# Patient Record
Sex: Female | Born: 1980 | Race: White | Hispanic: Yes | Marital: Married | Smoking: Never smoker
Health system: Southern US, Community
[De-identification: ages and names within clinical notes are randomized; demographics above are authoritative.]

## PROBLEM LIST (undated history)

## (undated) DIAGNOSIS — Z789 Other specified health status: Secondary | ICD-10-CM

## (undated) HISTORY — DX: Other specified health status: Z78.9

## (undated) HISTORY — PX: NO PAST SURGERIES: SHX2092

---

## 2005-01-11 ENCOUNTER — Ambulatory Visit: Payer: Self-pay | Admitting: Obstetrics and Gynecology

## 2005-12-02 ENCOUNTER — Inpatient Hospital Stay: Payer: Self-pay | Admitting: Obstetrics and Gynecology

## 2011-09-24 ENCOUNTER — Emergency Department: Payer: Self-pay | Admitting: Emergency Medicine

## 2011-10-07 ENCOUNTER — Ambulatory Visit: Payer: Self-pay | Admitting: Family Medicine

## 2012-03-21 ENCOUNTER — Ambulatory Visit: Payer: Self-pay | Admitting: Advanced Practice Midwife

## 2012-04-21 ENCOUNTER — Inpatient Hospital Stay: Payer: Self-pay | Admitting: Obstetrics and Gynecology

## 2012-04-21 LAB — CBC WITH DIFFERENTIAL/PLATELET
Basophil %: 0.4 %
Eosinophil #: 0 10*3/uL (ref 0.0–0.7)
Eosinophil %: 0.1 %
HCT: 37.6 % (ref 35.0–47.0)
HGB: 12.3 g/dL (ref 12.0–16.0)
Lymphocyte %: 19.8 %
MCH: 31.1 pg (ref 26.0–34.0)
MCV: 95 fL (ref 80–100)
Monocyte %: 6.9 %
Neutrophil #: 13.3 10*3/uL — ABNORMAL HIGH (ref 1.4–6.5)
Platelet: 221 10*3/uL (ref 150–440)
WBC: 18.3 10*3/uL — ABNORMAL HIGH (ref 3.6–11.0)

## 2012-04-22 LAB — HEMOGLOBIN: HGB: 10.5 g/dL — ABNORMAL LOW (ref 12.0–16.0)

## 2013-10-25 IMAGING — US US OB < 14 WEEKS
1 series · 17 of 28 positions shown · non-contrast
Comparison: none

REASON FOR EXAM: right pelvic pain, 10 weeks pregnant
COMMENTS:   LMP: [DATE]

[Series 1: us ob < 14 weeks · 17 of 46 slices shown]
[im 1/46]
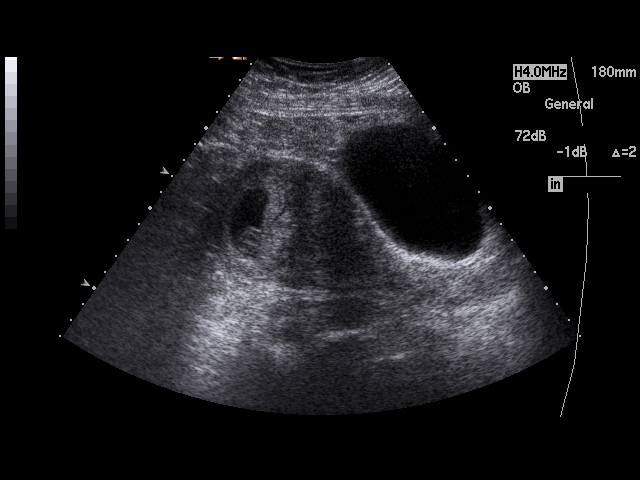
[im 4/46]
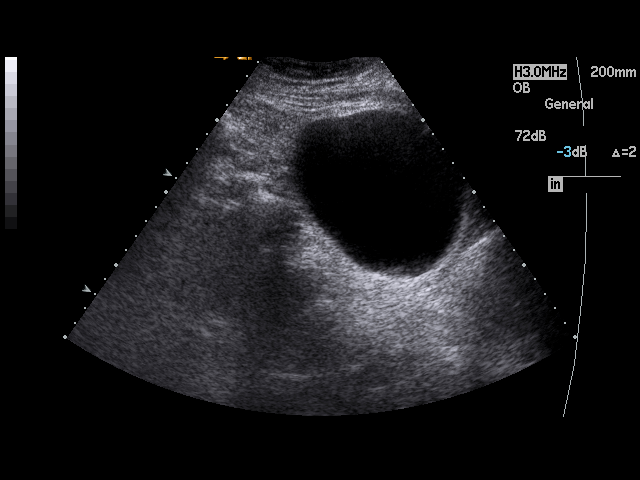
[im 7/46]
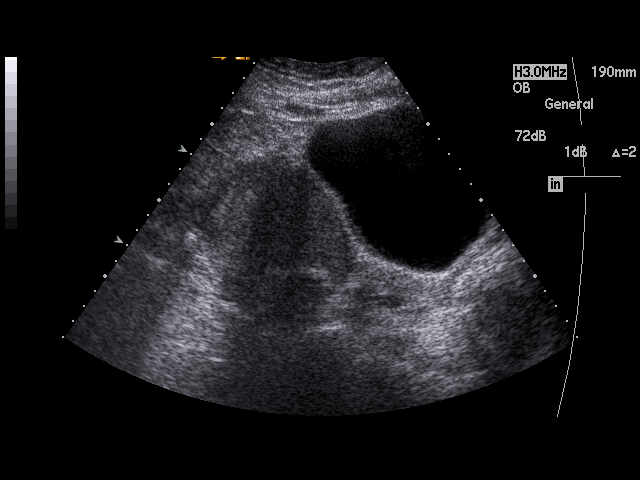
[im 9/46]
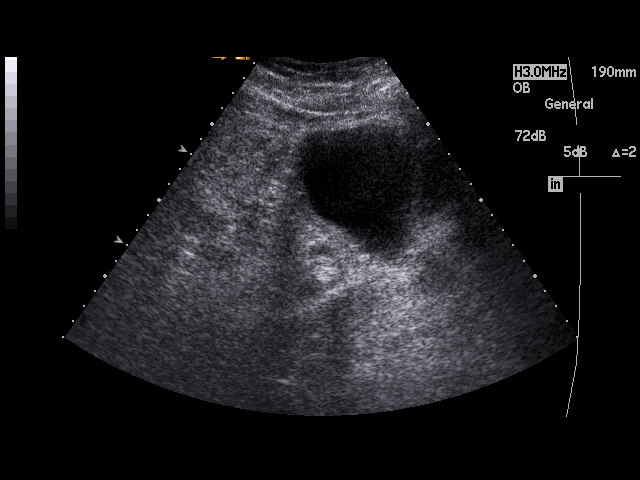
[im 12/46]
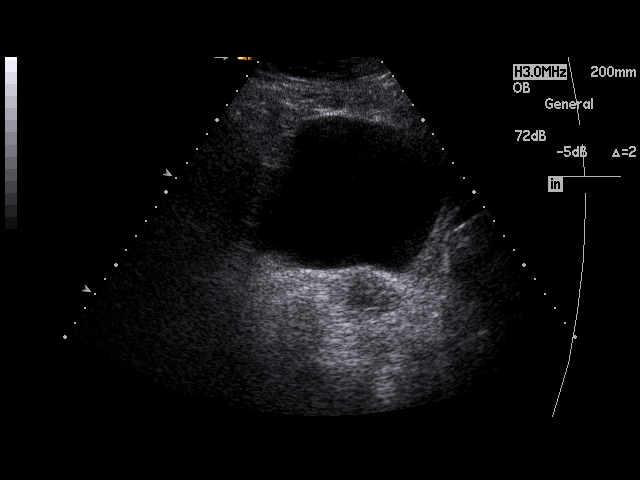
[im 16/46]
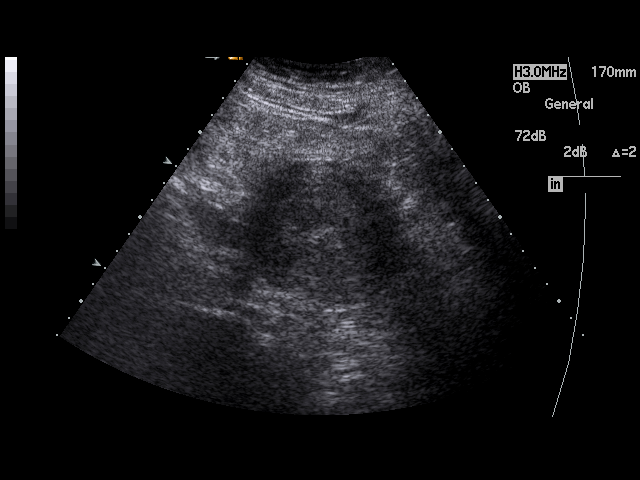
[im 17/46]
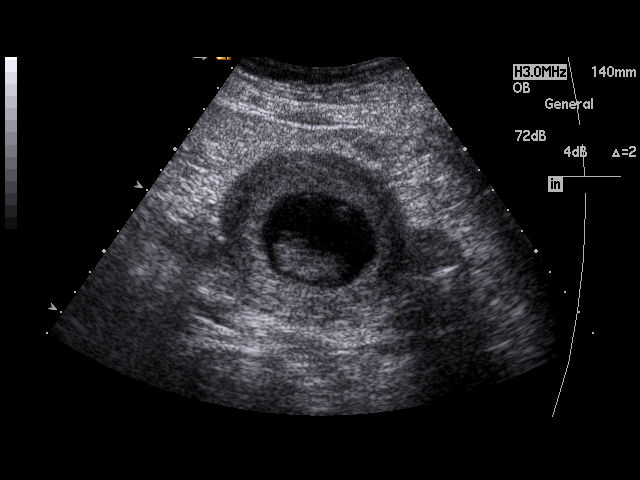
[im 21/46]
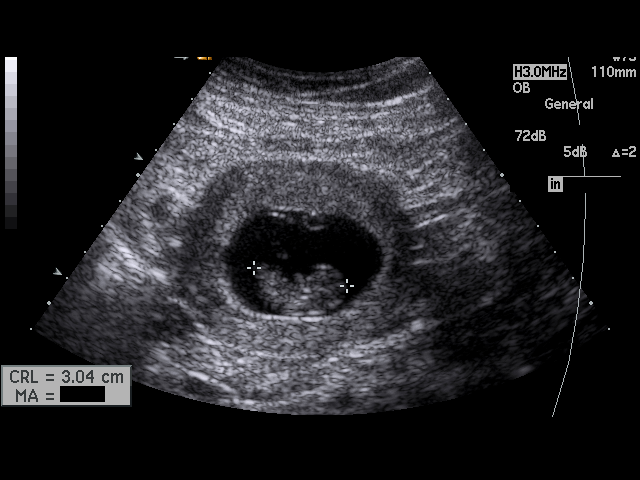
[im 24/46]
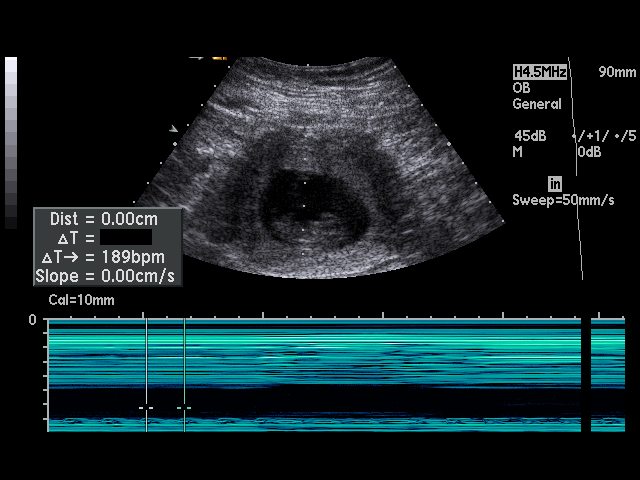
[im 26/46]
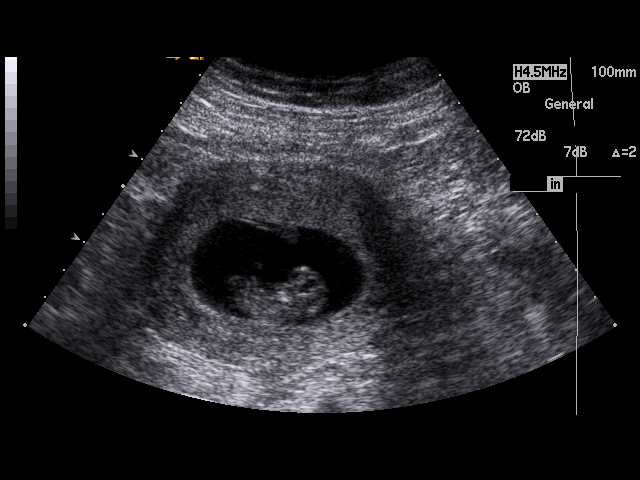
[im 29/46]
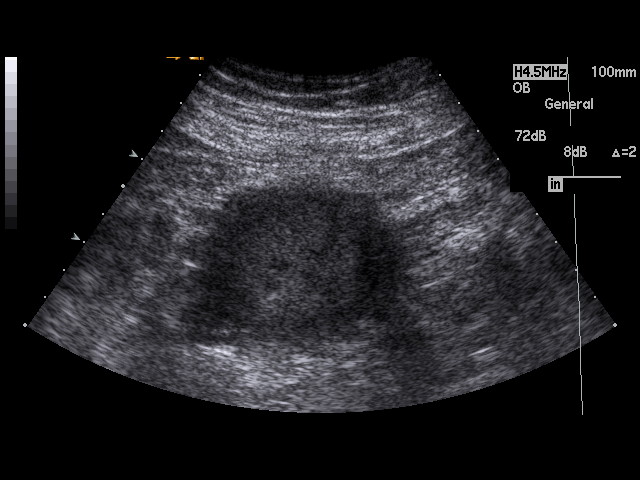
[im 31/46]
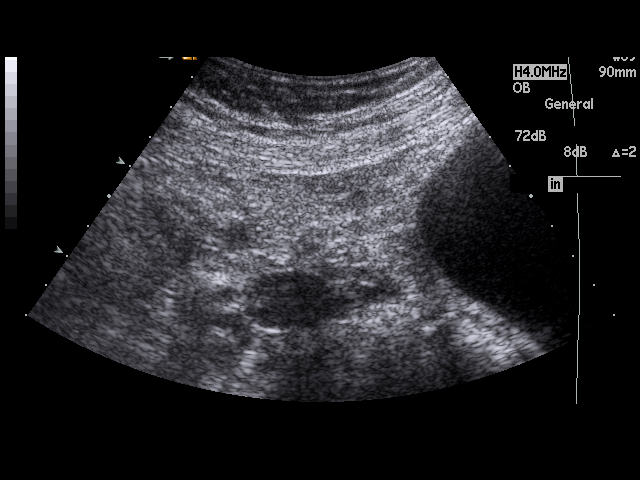
[im 34/46]
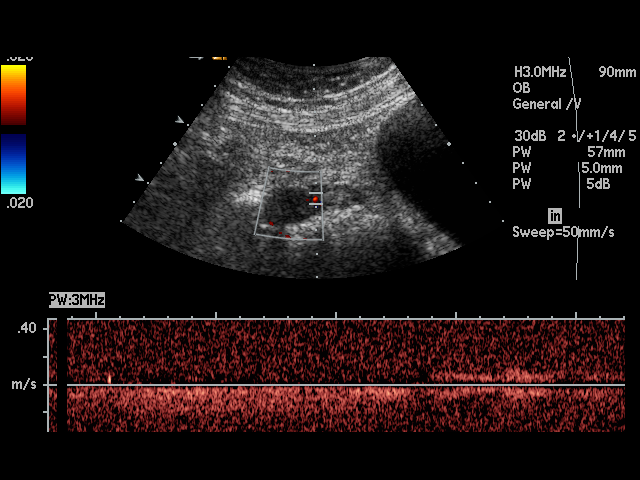
[im 37/46]
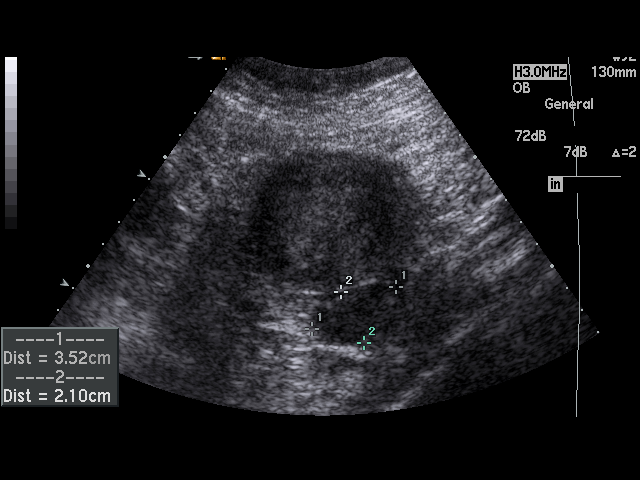
[im 39/46]
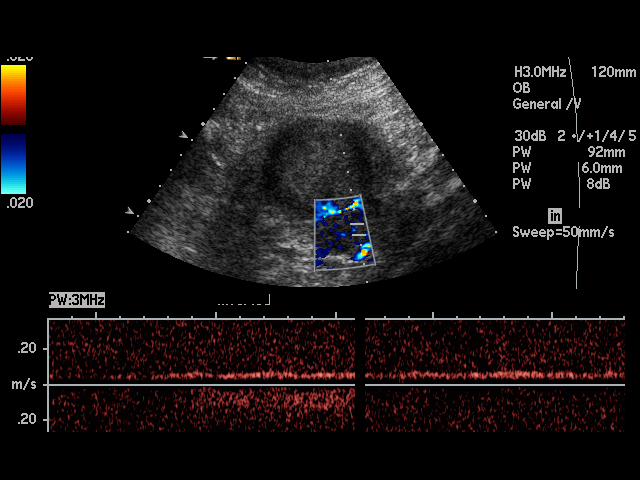
[im 42/46]
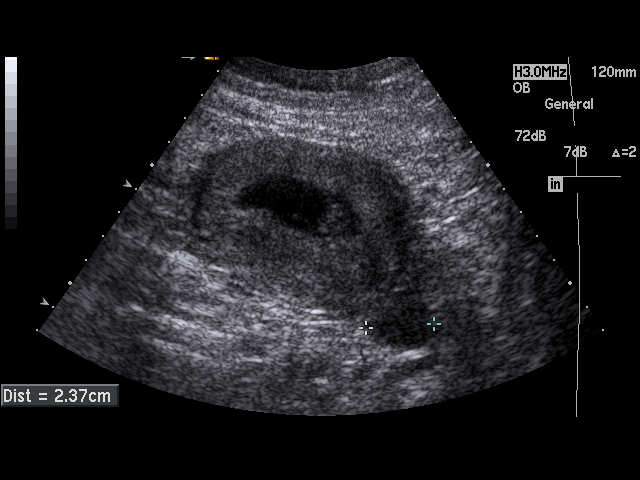
[im 46/46]
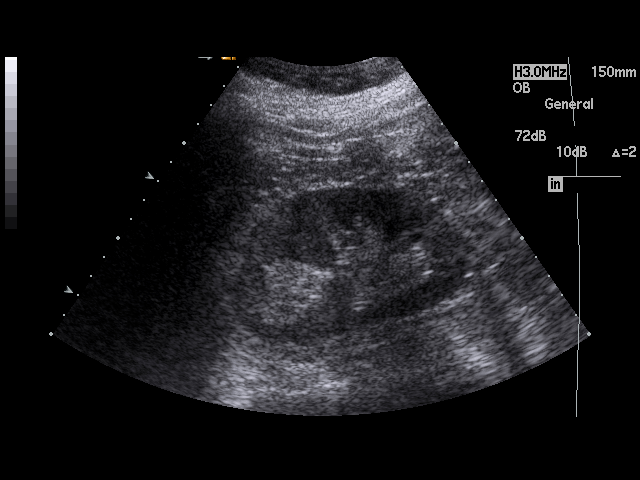

[17 of 28 positions shown; findings below may reference images not displayed]

PROCEDURE:     US  - US OB LESS THAN 14 WEEKS  - September 25, 2011  [DATE]

RESULT:     There is an IUP present. The crown-rump length measures 2.96 cm
corresponding to a 9 week 5 day gestation. A yolk sac is demonstrated. The
fetal cardiac rate is 189 beats per minute. The maternal ovaries exhibit
normal echotexture. The right ovary measures 3 x 2.6 x 1.6 cm. The left
ovary measures 3.5 x 2.4 x 2 cm. There is no free fluid in the pelvis.
IMPRESSION: There is a viable IUP with estimated gestational age of 9
weeks 5 days + / - 5 days. The estimated date of confinement is 24 April, 2012.
No pelvic masses are demonstrated.

A preliminary report was sent to the [HOSPITAL] the conclusion
of the study.

## 2014-04-21 IMAGING — US US OB US >=[ID] SNGL FETUS
1 series · 14 of 28 positions shown · non-contrast
Comparison: none

REASON FOR EXAM: size greater than dates
COMMENTS:

[Series 1: us ob us >=(id) sngl fetus · 0.31mm/px · 14 of 73 slices shown]
[im 3/73]
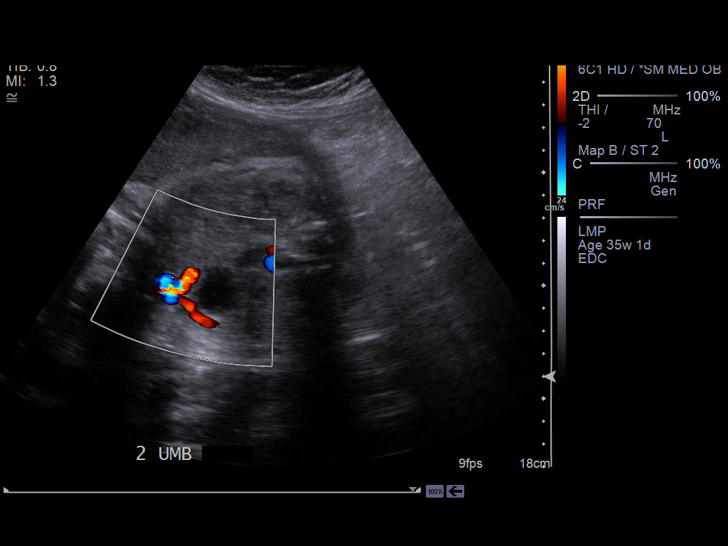
[im 9/73]
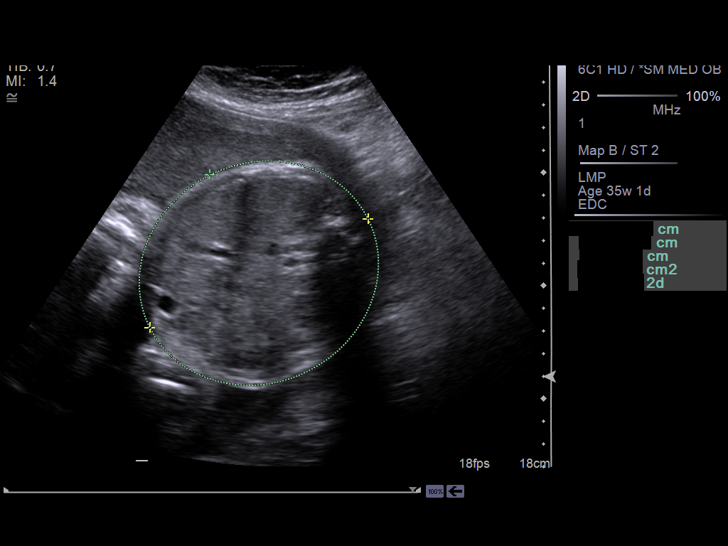
[im 14/73]
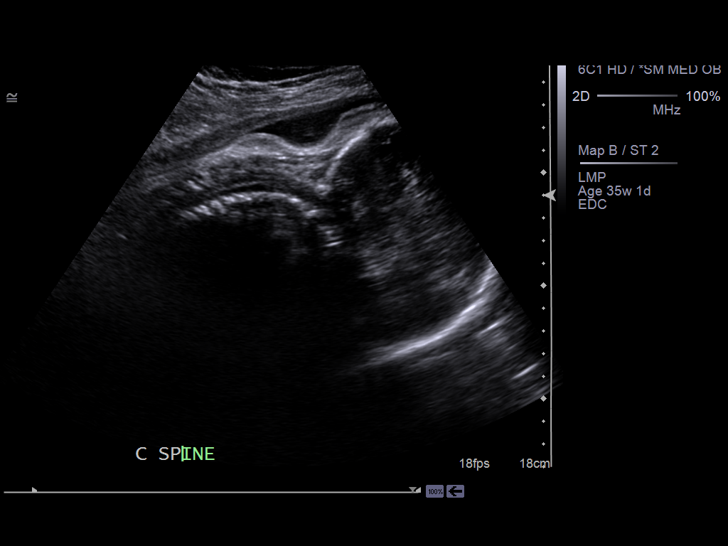
[im 19/73]
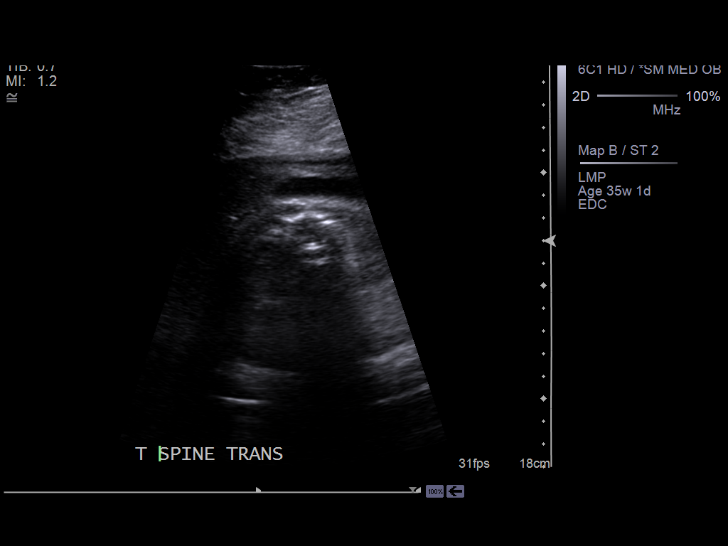
[im 25/73]
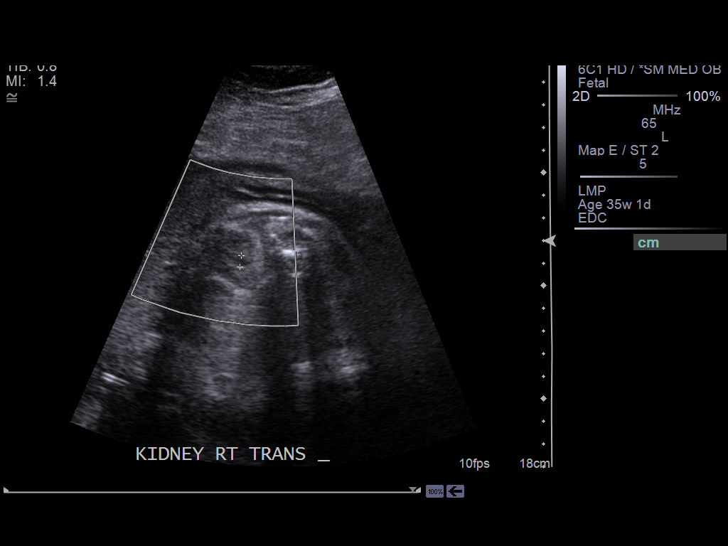
[im 30/73]
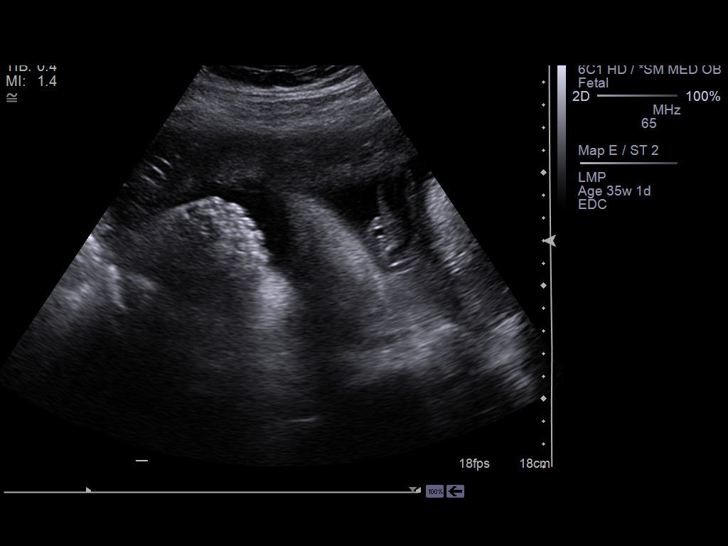
[im 35/73]
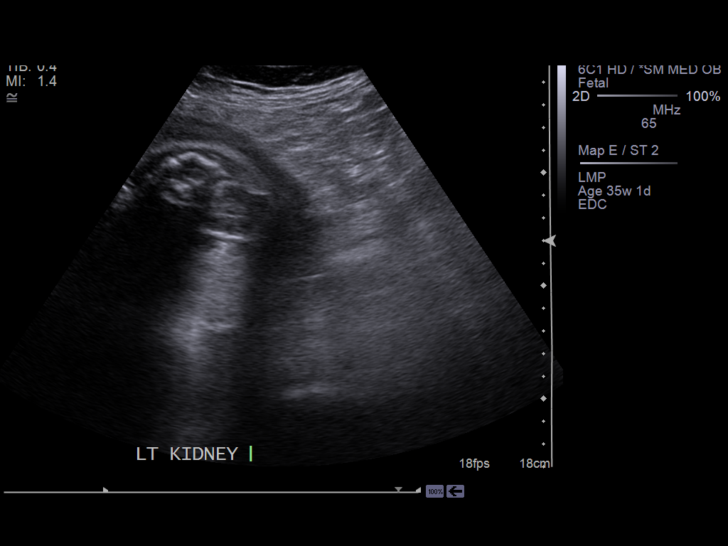
[im 41/73]
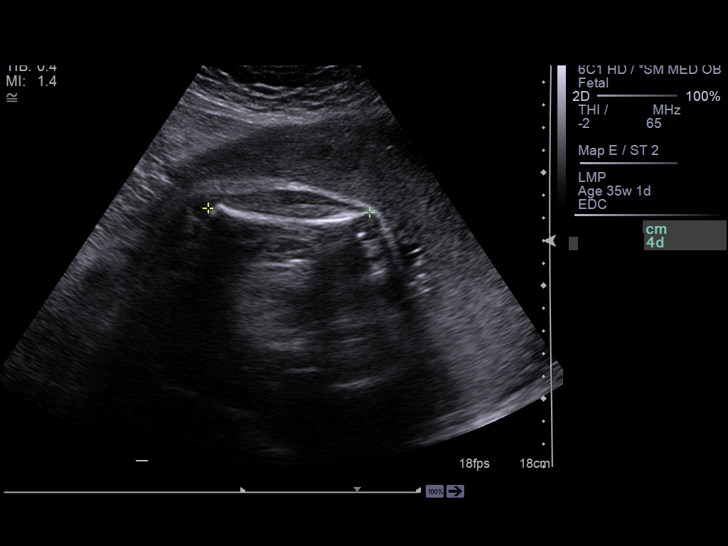
[im 46/73]
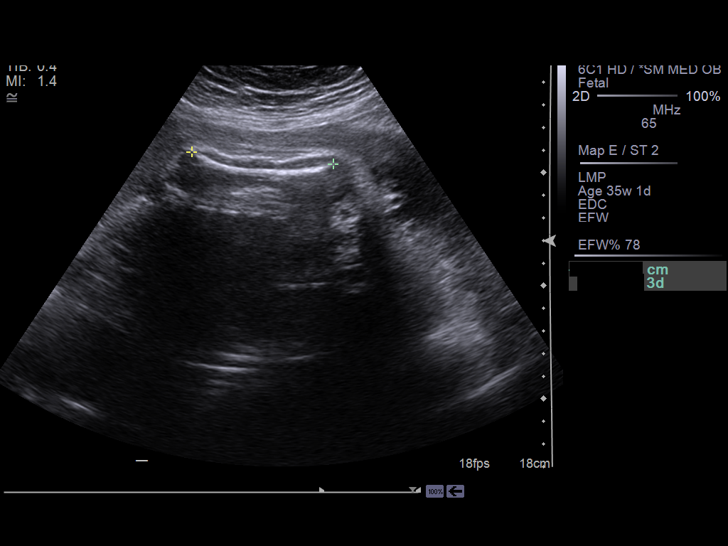
[im 51/73]
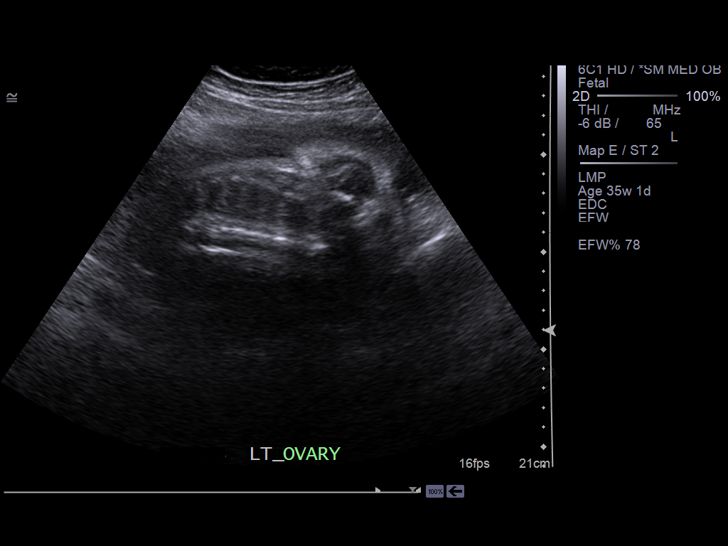
[im 57/73]
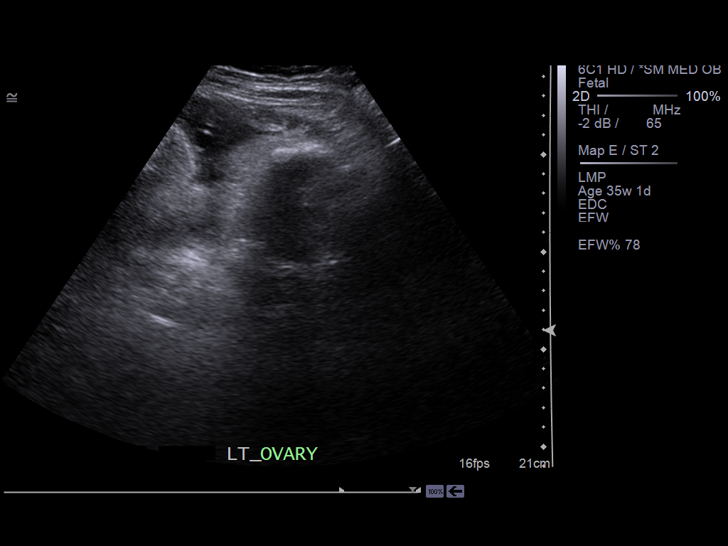
[im 62/73]
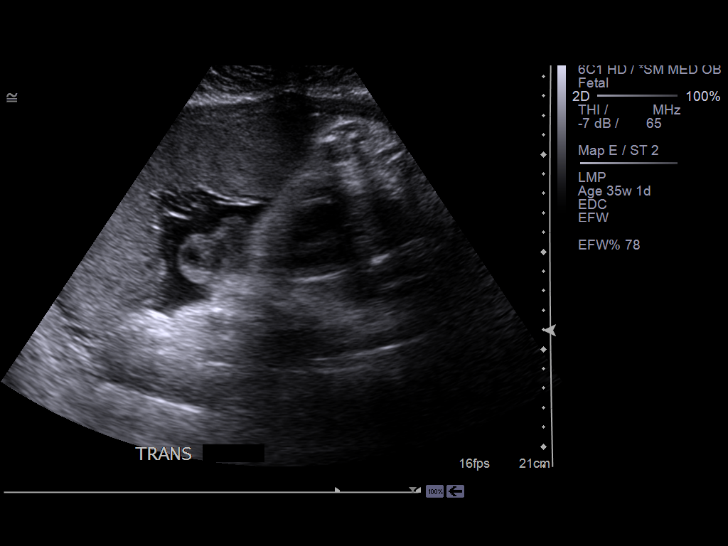
[im 67/73]
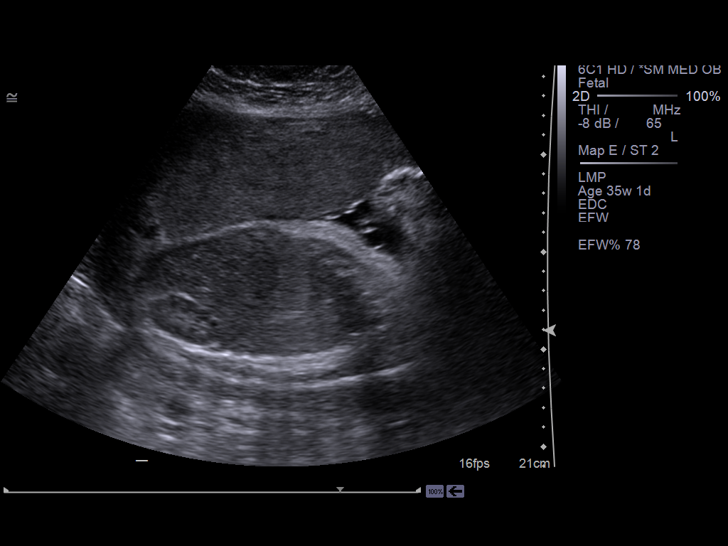
[im 73/73]
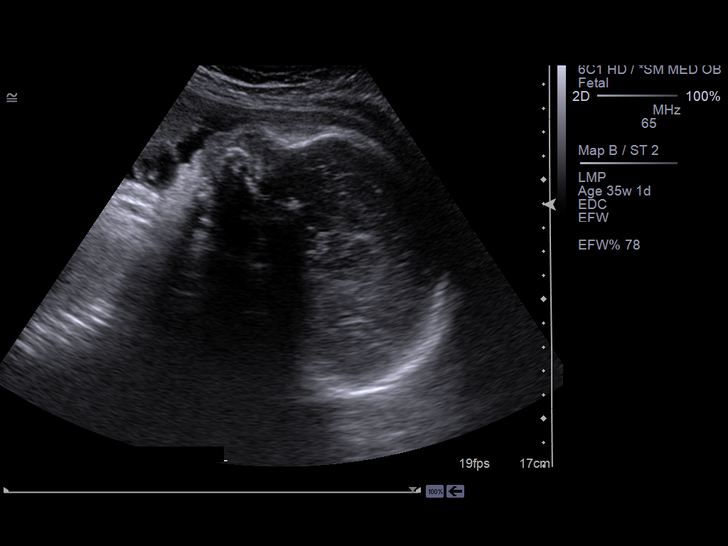

[14 of 28 positions shown; findings below may reference images not displayed]

PROCEDURE:     US  - US OB GREATER/OR EQUAL TO 7CTPG  - March 21, 2012  [DATE]

RESULT:     There is observed a single living intrauterine gestation. Fetal
heart rate was monitored at 147 beats per minute. Amnionic fluid volume
appears normal. The placenta is anterior. The inferior tip of the placenta
is approximately 21 cm above the cervix. The fetal heart, stomach, and
urinary bladder are visualized. No hydrocephalus or hydronephrosis is seen.
No fetal abnormalities are detected. Fetal measurements are as follows:

BPD: 8.83 cm corresponding to 35 weeks-4 days
HC: 32.44 cm corresponding to 36 weeks-4 days
AC: 32.13 cm corresponding to 36 weeks-2 days
FL: 7.14 cm corresponding to 36 weeks-2 days

Average ultrasound age is 36 weeks-2 days. Ultrasound EDD is 04/18/2012. EFW
is 2,880 grams + / - 426 grams. AFI is 11.4 cm.
IMPRESSION: 1. Please see above.
2. There is a single living intrauterine gestation of approximately 36 weeks
menstrual age with ultrasound EDD based on today's measurements of 04/18/2012.
3. There has been appropriate interval growth since the prior exam.

[REDACTED]

## 2019-05-24 ENCOUNTER — Other Ambulatory Visit: Payer: Self-pay

## 2019-05-24 DIAGNOSIS — Z20822 Contact with and (suspected) exposure to covid-19: Secondary | ICD-10-CM

## 2019-05-25 LAB — NOVEL CORONAVIRUS, NAA: SARS-CoV-2, NAA: DETECTED — AB

## 2021-11-20 ENCOUNTER — Other Ambulatory Visit: Payer: Self-pay

## 2021-11-20 ENCOUNTER — Encounter: Payer: Self-pay | Admitting: Advanced Practice Midwife

## 2021-11-20 ENCOUNTER — Ambulatory Visit (LOCAL_COMMUNITY_HEALTH_CENTER): Payer: Self-pay | Admitting: Advanced Practice Midwife

## 2021-11-20 ENCOUNTER — Ambulatory Visit: Payer: Self-pay

## 2021-11-20 VITALS — BP 115/67 | HR 69 | Temp 98.4°F | Resp 16 | Ht 62.0 in | Wt 159.0 lb

## 2021-11-20 DIAGNOSIS — Z3009 Encounter for other general counseling and advice on contraception: Secondary | ICD-10-CM

## 2021-11-20 DIAGNOSIS — E669 Obesity, unspecified: Secondary | ICD-10-CM | POA: Insufficient documentation

## 2021-11-20 DIAGNOSIS — Z3046 Encounter for surveillance of implantable subdermal contraceptive: Secondary | ICD-10-CM

## 2021-11-20 LAB — WET PREP FOR TRICH, YEAST, CLUE
Trichomonas Exam: NEGATIVE
Yeast Exam: NEGATIVE

## 2021-11-20 NOTE — Progress Notes (Signed)
See encounter dated 11/20/21.

## 2021-11-20 NOTE — Progress Notes (Signed)
[facility] [address] Main Number: [phone]    Family Planning Visit- Initial Visit  Subjective:  Lauren Alexander is a 41 y.o. MHF nonsmoker [id number] (24, 22, 15, 9)  being seen today for an initial annual visit and to discuss reproductive life planning.  The patient is currently using Hormonal Implant for pregnancy prevention. Patient reports   does not want a pregnancy in the next year.  Patient has the following medical conditions has Obesity BMI=29 on their problem list.  Chief Complaint  Patient presents with   Annual Exam    Patient reports here for physical, pap, Nexplanon removal. LMP 10/24/21 but looks like a menses today. Last sex 11/13/21 without condom; with current partner x 26 years; 1 partner in last 3 mo. Working 40 hrs/wk and living with her husband and 3 kids (15, 20, another child she has custody of). Last dental exam 6 mo ago. Happy with Nexplanon. Last pap 12/23/15 neg HPV neg. Nexplanon inserted 11/20/18  Patient denies cigs, vaping, cigar, MJ, ETOH  Body mass index is 29.08 kg/m. - Patient is eligible for diabetes screening based on BMI and age >17?  Pt declines HA1C ordered? Pt declines  Patient reports 1  partner/s in last year. Desires STI screening?  No - declines bloodwork  Has patient been screened once for HCV in the past?  No  No results found for: HCVAB  Does the patient have current drug use (including MJ), have a partner with drug use, and/or has been incarcerated since last result? No  If yes-- Screen for HCV through [facility]   Does the patient meet criteria for HBV testing? No  Criteria:  -Household, sexual or needle sharing contact with HBV -History of drug use -HIV positive -Those with known Hep C   Health Maintenance Due  Topic Date Due   COVID-19 Vaccine (1) Never done   HIV Screening  Never done   Hepatitis C Screening  Never done   PAP SMEAR-Modifier  Never  done   INFLUENZA VACCINE  05/04/2021    Review of Systems  All other systems reviewed and are negative.  The following portions of the patient's history were reviewed and updated as appropriate: allergies, current medications, past family history, past medical history, past social history, past surgical history and problem list. Problem list updated.   See flowsheet for other program required questions.  Objective:   Vitals:   11/20/21 1537  BP: 115/67  Pulse: 69  Resp: 16  Temp: 98.4 F (36.9 C)  TempSrc: Oral  Weight: 159 lb (72.1 kg)  Height: 5\' 2"  (1.575 m)    Physical Exam Constitutional:      Appearance: Normal appearance. She is obese.  HENT:     Head: Normocephalic and atraumatic.     Mouth/Throat:     Mouth: Mucous membranes are moist.     Comments: Last dental exam 6 mo ago Eyes:     Conjunctiva/sclera: Conjunctivae normal.  Neck:     Thyroid: No thyroid mass, thyromegaly or thyroid tenderness.  Cardiovascular:     Rate and Rhythm: Normal rate and regular rhythm.  Pulmonary:     Effort: Pulmonary effort is normal.     Breath sounds: Normal breath sounds.  Chest:  Breasts:    Right: Normal.     Left: Normal.  Abdominal:     Palpations: Abdomen is soft.     Comments: Soft, poor tone, without masses  or tenderness  Genitourinary:    General: Normal vulva.     Exam position: Lithotomy position.     Vagina: Bleeding (red menses blood, ph>4.5) present.     Cervix: Normal.     Uterus: Normal.      Adnexa: Right adnexa normal and left adnexa normal.     Rectum: Normal.     Comments: Pap done Musculoskeletal:        General: Normal range of motion.     Cervical back: Normal range of motion and neck supple.  Skin:    General: Skin is warm and dry.  Neurological:     Mental Status: She is alert.  Psychiatric:        Mood and Affect: Mood normal.      Assessment and Plan:  Lauren Alexander is a 40 y.o. female presenting to the [facility] Department for an initial annual wellness/contraceptive visit  Contraception counseling: Reviewed all forms of birth control options in the tiered based approach. available including abstinence; over the counter/barrier methods; hormonal contraceptive medication including pill, patch, ring, injection,contraceptive implant, ECP; hormonal and nonhormonal IUDs; permanent sterilization options including vasectomy and the various tubal sterilization modalities. Risks, benefits, and typical effectiveness rates were reviewed.  Questions were answered.  Written information was also given to the patient to review.  Patient desires Hormonal Implant, this was prescribed for patient.    The patient will follow up in  1 year for surveillance.  The patient was told to call with any further questions, or with any concerns about this method of contraception.  Emphasized use of condoms 100% of the time for STI prevention.  Patient was not offered ECP based on not meeting criteria. ECP was not accepted by the patient. ECP counseling was not given - see RN documentation  1. Obesity, unspecified classification, unspecified obesity type, unspecified whether serious comorbidity present   2. Family planning Treat wet mount per standing orders Immunization nurse consult Counseled pt to get mammogram - WET PREP FOR TRICH, YEAST, CLUE - Chlamydia/Gonorrhea [place] [facility] - IGP, Aptima HPV  3. Encounter for surveillance of implantable subdermal contraceptive Nexplanon inserted 11/20/18 and good for 4 years; pt counseled     No follow-ups on file.  No future appointments.  Alberteen Spindle, CNM

## 2021-11-20 NOTE — Progress Notes (Signed)
[employer] reviewed - no treatment indicated.   Floy Sabina, RN

## 2021-11-23 ENCOUNTER — Ambulatory Visit: Payer: Self-pay

## 2021-11-26 LAB — IGP, APTIMA HPV
HPV Aptima: NEGATIVE
PAP Smear Comment: 0

## 2022-11-01 ENCOUNTER — Encounter: Payer: Self-pay | Admitting: Family Medicine

## 2022-11-15 NOTE — Progress Notes (Signed)
Erroneous - disregard °

## 2022-11-18 ENCOUNTER — Ambulatory Visit: Payer: Self-pay

## 2022-11-23 ENCOUNTER — Encounter: Payer: Self-pay | Admitting: Family Medicine

## 2022-11-23 ENCOUNTER — Ambulatory Visit (LOCAL_COMMUNITY_HEALTH_CENTER): Payer: Self-pay | Admitting: Family Medicine

## 2022-11-23 ENCOUNTER — Ambulatory Visit: Payer: Self-pay | Admitting: Family Medicine

## 2022-11-23 VITALS — BP 133/88 | HR 70 | Ht 62.0 in | Wt 160.0 lb

## 2022-11-23 DIAGNOSIS — Z3009 Encounter for other general counseling and advice on contraception: Secondary | ICD-10-CM

## 2022-11-23 DIAGNOSIS — Z3046 Encounter for surveillance of implantable subdermal contraceptive: Secondary | ICD-10-CM

## 2022-11-23 DIAGNOSIS — H00012 Hordeolum externum right lower eyelid: Secondary | ICD-10-CM

## 2022-11-23 DIAGNOSIS — Z0000 Encounter for general adult medical examination without abnormal findings: Secondary | ICD-10-CM

## 2022-11-23 MED ORDER — ETONOGESTREL 68 MG ~~LOC~~ IMPL
68.0000 mg | DRUG_IMPLANT | Freq: Once | SUBCUTANEOUS | Status: AC
  Administered 2022-11-23: 68 mg via SUBCUTANEOUS

## 2022-11-23 NOTE — Progress Notes (Signed)
Pt is here for PE and Nexplanon removal/insertion.  FP packet given.  Pt declines STD screening.  Windle Guard, RN

## 2022-11-23 NOTE — Progress Notes (Signed)
[facility] Number: [phone]  Family Planning Visit- Repeat Yearly Visit  Subjective:  Lauren Alexander is a 42 y.o. [id number]  being seen today for an annual wellness visit and to discuss contraception options.   The patient is currently using Hormonal Implant for pregnancy prevention. Patient does not want a pregnancy in the next year.    report they are looking for a method that provides High efficacy at preventing pregnancy   Patient has the following medical problems: has Obesity BMI=29 on their problem list.  Chief Complaint  Patient presents with   Contraception    Physical and nexplanon removal and reinsertion    Patient reports to clinic for PE and nexplanon exchange  See flowsheet for other program required questions.   Body mass index is 29.26 kg/m. - Patient is eligible for diabetes screening based on BMI> 25 and age >35?  yes HA1C ordered? yes  Patient reports 1 of partners in last year. Desires STI screening?  No - declined   Has patient been screened once for HCV in the past?  No  No results found for: "HCVAB"  Does the patient have current of drug use, have a partner with drug use, and/or has been incarcerated since last result? No  If yes-- Screen for HCV through [facility]   Does the patient meet criteria for HBV testing? No  Criteria:  -Household, sexual or needle sharing contact with HBV -History of drug use -HIV positive -Those with known Hep C   Health Maintenance Due  Topic Date Due   COVID-19 Vaccine (1) Never done   HIV Screening  Never done   Hepatitis C Screening  Never done   DTaP/Tdap/Td (1 - Tdap) Never done   INFLUENZA VACCINE  05/04/2022    Review of Systems  Constitutional:  Negative for weight loss.  Eyes:  Negative for blurred vision.  Respiratory:  Negative for cough and shortness of breath.   Cardiovascular:  Negative for  claudication.  Gastrointestinal:  Negative for nausea.  Genitourinary:  Negative for dysuria and frequency.  Skin:  Negative for rash.  Neurological:  Negative for headaches.  Endo/Heme/Allergies:  Does not bruise/bleed easily.    The following portions of the patient's history were reviewed and updated as appropriate: allergies, current medications, past family history, past medical history, past social history, past surgical history and problem list. Problem list updated.  Objective:   Vitals:   11/23/22 1402  BP: 133/88  Pulse: 70  Weight: 160 lb (72.6 kg)  Height: 5' 2"$  (1.575 m)    Physical Exam Exam conducted with a chaperone present.  Constitutional:      Appearance: Normal appearance.  HENT:     Head: Normocephalic and atraumatic.  Pulmonary:     Effort: Pulmonary effort is normal.  Chest:     Chest wall: No mass.  Breasts:    Tanner Score is 5.     Right: Normal. No mass or nipple discharge.     Left: Normal. No mass or nipple discharge.  Abdominal:     Palpations: Abdomen is soft.  Musculoskeletal:        General: Normal range of motion.  Lymphadenopathy:     Upper Body:     Right upper body: No supraclavicular adenopathy.     Left upper body: No supraclavicular adenopathy.  Skin:    General: Skin is warm and dry.  Neurological:  General: No focal deficit present.     Mental Status: She is alert.  Psychiatric:        Mood and Affect: Mood normal.        Behavior: Behavior normal.     Assessment and Plan:  Lauren Alexander is a 42 y.o. female [phone] presenting to the [facility] Department for an yearly wellness and contraception visit  Contraception counseling: Reviewed options based on patient desire and reproductive life plan. Patient is interested in Hormonal Implant. This was provided to the patient today.   Risks, benefits, and typical effectiveness rates were reviewed.  Questions were answered.  Written information was  also given to the patient to review.    The patient will follow up in  1 years for surveillance.  The patient was told to call with any further questions, or with any concerns about this method of contraception.  Emphasized use of condoms 100% of the time for STI prevention.  Patient was assessed for need for ECP. Not indicated- Nexplanon in place  1. Well woman exam (no gynecological exam) -CBE today, normal, pt has never had mammogram -pt has insurance, encouraged to get PCP, and then ask for referral for mammogram -pap test not indicated today -A1c today  2. Encounter for removal and reinsertion of Nexplanon Nexplanon Removal and Insertion  Patient identified, informed consent performed, consent signed.   Patient does understand that irregular bleeding is a very common side effect of this medication. She was advised to have backup contraception for one week after replacement of the implant. Patient deemed to meet WHO criteria for being reasonably certain she is not pregnant.  Appropriate time out taken. Nexplanon site identified. Area prepped in usual sterile fashon. 3 ml of 1% lidocaine with epinephrine was used to anesthetize the area at the distal end of the implant. A small stab incision was made right beside the implant on the distal portion. The Nexplanon rod was grasped using hemostats and removed without difficulty. There was minimal blood loss. There were no complications.   Confirmed correct location of insertion site. The insertion site was identified 8-10 cm (3-4 inches) from the medial epicondyle of the humerus and 3-5 cm (1.25-2 inches) posterior to (below) the sulcus (groove) between the biceps and triceps muscles of the patient's left arm. New Nexplanon removed from packaging, Device confirmed in needle, then inserted full length of needle and withdrawn per handbook instructions. Nexplanon was able to palpated in the patient's left arm; patient palpated the insert herself.  There  was minimal blood loss. Patient insertion site covered with guaze and a pressure bandage to reduce any bruising. The patient tolerated the procedure well and was given post procedure instructions.     Nexplanon:   Counseled patient to take OTC analgesic starting as soon as lidocaine starts to wear off and take regularly for at least 48 hr to decrease discomfort.  Specifically to take with food or milk to decrease stomach upset and for IB 600 mg (3 tablets) every 6 hrs; IB 800 mg (4 tablets) every 8 hrs; or Aleve 2 tablets every 12 hrs.  3. Hordeolum externum of right lower eyelid -pt has a stye on her right lower eye lid -noticed it "months" ago; it has grown and has started to bother her -given number for [facility] and encouraged her to go there for evaluation   Return if symptoms worsen or fail to improve.  No future appointments.  Sharlet Salina, [place]

## 2022-11-24 LAB — HGB A1C W/O EAG: Hgb A1c MFr Bld: 5.7 % — ABNORMAL HIGH (ref 4.8–5.6)

## 2022-11-25 ENCOUNTER — Telehealth: Payer: Self-pay

## 2022-11-25 NOTE — Telephone Encounter (Signed)
-----   Message from [place], [place] sent at 11/24/2022  9:26 AM EST ----- Prediabetes. Please contact patient and encourage seeing a PCP.

## 2022-11-26 ENCOUNTER — Telehealth: Payer: Self-pay

## 2022-11-26 NOTE — Telephone Encounter (Signed)
RN called with interpreter to notify pt of her HA1C results. See communication note.
